# Patient Record
Sex: Female | Born: 1980 | Race: Black or African American | Hispanic: No | Marital: Single | Smoking: Never smoker
Health system: Southern US, Community
[De-identification: ages and names within clinical notes are randomized; demographics above are authoritative.]

---

## 2004-09-18 ENCOUNTER — Emergency Department: Payer: Self-pay | Admitting: Internal Medicine

## 2004-09-22 ENCOUNTER — Emergency Department: Payer: Self-pay | Admitting: Emergency Medicine

## 2013-02-28 ENCOUNTER — Emergency Department: Payer: Self-pay | Admitting: Emergency Medicine

## 2013-05-03 ENCOUNTER — Emergency Department: Payer: Self-pay | Admitting: Emergency Medicine

## 2013-05-03 LAB — URINALYSIS, COMPLETE
Bilirubin,UR: NEGATIVE
Glucose,UR: NEGATIVE mg/dL (ref 0–75)
Leukocyte Esterase: NEGATIVE
Nitrite: NEGATIVE
Squamous Epithelial: 4
WBC UR: 3 /HPF (ref 0–5)

## 2013-05-03 LAB — COMPREHENSIVE METABOLIC PANEL
Alkaline Phosphatase: 71 U/L (ref 50–136)
BUN: 6 mg/dL — ABNORMAL LOW (ref 7–18)
Calcium, Total: 9 mg/dL (ref 8.5–10.1)
Chloride: 104 mmol/L (ref 98–107)
EGFR (African American): >60
Glucose: 90 mg/dL (ref 65–99)
Osmolality: 271 (ref 275–301)
Potassium: 3.3 mmol/L — ABNORMAL LOW (ref 3.5–5.1)
SGOT(AST): 19 U/L (ref 15–37)
Sodium: 137 mmol/L (ref 136–145)
Total Protein: 8.6 g/dL — ABNORMAL HIGH (ref 6.4–8.2)

## 2013-05-03 LAB — CBC
MCH: 28.9 pg (ref 26.0–34.0)
MCHC: 33.4 g/dL (ref 32.0–36.0)
MCV: 87 fL (ref 80–100)
Platelet: 258 10*3/uL (ref 150–440)
WBC: 6.9 10*3/uL (ref 3.6–11.0)

## 2013-05-05 LAB — URINE CULTURE

## 2013-05-06 ENCOUNTER — Emergency Department: Payer: Self-pay | Admitting: Emergency Medicine

## 2017-10-25 ENCOUNTER — Emergency Department
Admission: EM | Admit: 2017-10-25 | Discharge: 2017-10-25 | Disposition: A | Discharge status: Home / Self Care | Payer: Self-pay | Attending: Emergency Medicine | Admitting: Emergency Medicine

## 2017-10-25 ENCOUNTER — Encounter: Payer: Self-pay | Admitting: Intensive Care

## 2017-10-25 ENCOUNTER — Emergency Department: Payer: Self-pay

## 2017-10-25 DIAGNOSIS — R519 Headache, unspecified: Secondary | ICD-10-CM

## 2017-10-25 DIAGNOSIS — R51 Headache: Secondary | ICD-10-CM | POA: Insufficient documentation

## 2017-10-25 LAB — COMPREHENSIVE METABOLIC PANEL
ALBUMIN: 4.7 g/dL (ref 3.5–5.0)
ALK PHOS: 48 U/L (ref 38–126)
ALT: 12 U/L — AB (ref 14–54)
AST: 29 U/L (ref 15–41)
Anion gap: 9 (ref 5–15)
BUN: 7 mg/dL (ref 6–20)
CALCIUM: 9.4 mg/dL (ref 8.9–10.3)
CHLORIDE: 105 mmol/L (ref 101–111)
CO2: 23 mmol/L (ref 22–32)
CREATININE: 0.52 mg/dL (ref 0.44–1.00)
GFR calc Af Amer: >60 mL/min (ref >60)
GFR calc non Af Amer: >60 mL/min (ref >60)
GLUCOSE: 87 mg/dL (ref 65–99)
Potassium: 4.1 mmol/L (ref 3.5–5.1)
SODIUM: 137 mmol/L (ref 135–145)
Total Bilirubin: 0.8 mg/dL (ref 0.3–1.2)
Total Protein: 8.5 g/dL — ABNORMAL HIGH (ref 6.5–8.1)

## 2017-10-25 LAB — CBC
HEMATOCRIT: 41.6 % (ref 35.0–47.0)
HEMOGLOBIN: 13.5 g/dL (ref 12.0–16.0)
MCH: 28 pg (ref 26.0–34.0)
MCHC: 32.4 g/dL (ref 32.0–36.0)
MCV: 86.4 fL (ref 80.0–100.0)
Platelets: 235 10*3/uL (ref 150–440)
RBC: 4.81 MIL/uL (ref 3.80–5.20)
RDW: 12.7 % (ref 11.5–14.5)
WBC: 5.6 10*3/uL (ref 3.6–11.0)

## 2017-10-25 LAB — URINALYSIS, COMPLETE (UACMP) WITH MICROSCOPIC
BACTERIA UA: NONE SEEN
BILIRUBIN URINE: NEGATIVE
Glucose, UA: NEGATIVE mg/dL
Hgb urine dipstick: NEGATIVE
KETONES UR: NEGATIVE mg/dL
Leukocytes, UA: NEGATIVE
NITRITE: NEGATIVE
PH: 7 (ref 5.0–8.0)
Protein, ur: NEGATIVE mg/dL
RBC / HPF: NONE SEEN RBC/hpf (ref 0–5)
SPECIFIC GRAVITY, URINE: 1.02 (ref 1.005–1.030)
WBC UA: NONE SEEN WBC/hpf (ref 0–5)

## 2017-10-25 LAB — POCT PREGNANCY, URINE: PREG TEST UR: NEGATIVE

## 2017-10-25 MED ORDER — KETOROLAC TROMETHAMINE 30 MG/ML IJ SOLN
30.0000 mg | Freq: Once | INTRAMUSCULAR | Status: AC
  Administered 2017-10-25: 30 mg via INTRAVENOUS

## 2017-10-25 MED ORDER — KETOROLAC TROMETHAMINE 10 MG PO TABS: 10.0000 mg | ORAL_TABLET | Freq: Four times a day (QID) | ORAL | 0 refills | Status: DC | PRN

## 2017-10-25 MED ORDER — KETOROLAC TROMETHAMINE 30 MG/ML IJ SOLN
30.0000 mg | Freq: Once | INTRAMUSCULAR | Status: DC
Start: 2017-10-25 — End: 2017-10-25
  Filled 2017-10-25: qty 1

## 2017-10-25 NOTE — ED Triage Notes (Addendum)
Patient c/o headache and blurry vision for two weeks. Patient has been seen by PCP and given sudafed for sinus infection. Patient drove here and reports no problems. Ambulatory in triage. Patient asked multiple times if she could drink her coffee while triaging

## 2017-10-25 NOTE — ED Notes (Signed)
See triage note  Presents with frontal headache and pressure behind eyes  States this pain has been on and off for "a while"  Has seen PCP and placed on sudafed for sinus problems  But states it did not help

## 2017-10-25 NOTE — ED Provider Notes (Signed)
[facility] Emergency Department Provider Note  ____________________________________________  Time seen: Approximately 9:37 AM  I have reviewed the triage vital signs and the nursing notes.   HISTORY  Chief Complaint Headache    HPI Robin Cohen is a 37 y.o. female that presents to the emergency department for evaluation of headache on and off since November.  Current headache has been on and off for 2 weeks.  Pain is primarily behind both of her eyes but wraps around to the sides.  She has had nausea, blurry vision and dizziness on and off as well.  She states that blurry vision is worse with distances.  She states that she was just reading her cell phone without blurry vision however when she looks at me, her vision seems blurry.  She is not sure what starts first, the blurry vision of the headache.  She does not wear glasses or contacts and has not seen an eye doctor recently.  She saw her primary care provider, who gave her Sudafed, Amoxicillin, and promethazine, which has not helped.  No shortness of breath, chest pain, nausea, vomiting, abdominal pain.   History reviewed. No pertinent past medical history.  There are no active problems to display for this patient.   History reviewed. No pertinent surgical history.  Prior to Admission medications   Medication Sig Start Date End Date Taking? Authorizing Provider  ketorolac (TORADOL) 10 MG tablet Take 1 tablet (10 mg total) by mouth every 6 (six) hours as needed. 10/25/17   Enid Derry, PA-C    Allergies Patient has no known allergies.  History reviewed. No pertinent family history.  Social History Social History   Tobacco Use  . Smoking status: Never Smoker  . Smokeless tobacco: Never Used  Substance Use Topics  . Alcohol use: Yes    Comment: occ  . Drug use: No     Review of Systems  Constitutional: No fever/chills Cardiovascular: No chest pain. Respiratory: No  SOB. Gastrointestinal: No abdominal pain.  No nausea, no vomiting.  Musculoskeletal: Negative for musculoskeletal pain. Skin: Negative for rash, abrasions, lacerations, ecchymosis. Neurological: Negative for numbness or tingling   ____________________________________________   PHYSICAL EXAM:  VITAL SIGNS: ED Triage Vitals  Enc Vitals Group     BP 10/25/17 0814 (!) 129/92     Pulse Rate 10/25/17 0814 90     Resp 10/25/17 0814 16     Temp 10/25/17 0818 98 F (36.7 C)     Temp Source 10/25/17 0818 Oral     SpO2 10/25/17 0814 100 %     Weight 10/25/17 0815 125 lb (56.7 kg)     Height 10/25/17 0815 5\' 8"  (1.727 m)     Head Circumference --      Peak Flow --      Pain Score --      Pain Loc --      Pain Edu? --      Excl. in GC? --      Constitutional: Alert and oriented. Well appearing and in no acute distress. Eyes: Conjunctivae are normal. PERRL. EOMI. Head: Atraumatic. ENT:      Ears:      Nose: No congestion/rhinnorhea.      Mouth/Throat: Mucous membranes are moist.  Neck: No stridor.   Cardiovascular: Normal rate, regular rhythm.  Good peripheral circulation. Respiratory: Normal respiratory effort without tachypnea or retractions. Lungs CTAB. Good air entry to the bases with no decreased or absent breath sounds. Gastrointestinal: Bowel sounds  4 quadrants. Soft and nontender to palpation. No guarding or rigidity. No palpable masses. No distention.  Musculoskeletal: Full range of motion to all extremities. No gross deformities appreciated. Neurologic:  Normal speech and language. No gross focal neurologic deficits are appreciated.  Skin:  Skin is warm, dry and intact. No rash noted.   ____________________________________________   LABS (all labs ordered are listed, but only abnormal results are displayed)  Labs Reviewed  COMPREHENSIVE METABOLIC PANEL - Abnormal; Notable for the following components:      Result Value   Total Protein 8.5 (*)    ALT 12 (*)     All other components within normal limits  URINALYSIS, COMPLETE (UACMP) WITH MICROSCOPIC - Abnormal; Notable for the following components:   Squamous Epithelial / LPF 0-5 (*)    All other components within normal limits  CBC  POC URINE PREG, ED  POCT PREGNANCY, URINE   ____________________________________________  EKG   ____________________________________________  RADIOLOGY   Ct Head Wo Contrast  Result Date: 10/25/2017 CLINICAL DATA:  Recurrent headaches and blurry vision for 2 weeks. EXAM: CT HEAD WITHOUT CONTRAST TECHNIQUE: Contiguous axial images were obtained from the base of the skull through the vertex without intravenous contrast. COMPARISON:  Head CT scan 09/18/2004. FINDINGS: Brain: No evidence of acute infarction, hemorrhage, hydrocephalus, extra-axial collection or mass lesion/mass effect. Vascular: No hyperdense vessel or unexpected calcification. Skull: Normal. Negative for fracture or focal lesion. Sinuses/Orbits: No acute finding. Other: None. IMPRESSION: Normal head CT. Electronically Signed   By: Drusilla Kannerhomas  Dalessio M.D.   On: 10/25/2017 11:10    ____________________________________________    PROCEDURES  Procedure(s) performed:    Procedures    Medications  ketorolac (TORADOL) 30 MG/ML injection 30 mg (30 mg Intravenous Given 10/25/17 1155)     ____________________________________________   INITIAL IMPRESSION / ASSESSMENT AND PLAN / ED COURSE  Pertinent labs & imaging results that were available during my care of the patient were reviewed by me and considered in my medical decision making (see chart for details).  Review of the [place] CSRS was performed in accordance of the [facility] prior to dispensing any controlled drugs.   Patient presented to the emergency department for evaluation of headache on and off since November. Vital signs, labwork and exam are reassuring. No infection on urinalysis.  No acute findings on CT. IM toradol was given in ED. Patient  will be discharged home with prescriptions for toradol. It is possible that patients vision os worsening and this is causing her headaches. Patient is to follow up with neurology and [facility] next week. Patient is given ED precautions to return to the ED for any worsening or new symptoms.     ____________________________________________  FINAL CLINICAL IMPRESSION(S) / ED DIAGNOSES  Final diagnoses:  Acute intractable headache, unspecified headache type      NEW MEDICATIONS STARTED DURING THIS VISIT:  ED Discharge Orders        Ordered    ketorolac (TORADOL) 10 MG tablet  Every 6 hours PRN     10/25/17 1146          This chart was dictated using voice recognition software/Dragon. Despite best efforts to proofread, errors can occur which can change the meaning. Any change was purely unintentional.    Enid DerryWagner, Korah Hufstedler, PA-C 10/25/17 1601    Sharyn CreamerQuale, Mark, MD 10/25/17 1650

## 2018-08-03 IMAGING — CT CT HEAD W/O CM
3 series · 16 of 46 positions shown, 19 images · non-contrast
Comparison: Head CT scan 09/18/2004.

CLINICAL DATA: Recurrent headaches and blurry vision for 2 weeks.

EXAM:
CT HEAD WITHOUT CONTRAST
TECHNIQUE: Contiguous axial images were obtained from the base of the skull
through the vertex without intravenous contrast.

[Series 2: head wo · axial · 0.43mm/px · z∈[-174,-54]mm · 10 of 29 slices shown, 13 images]
[im 3/29  brain]
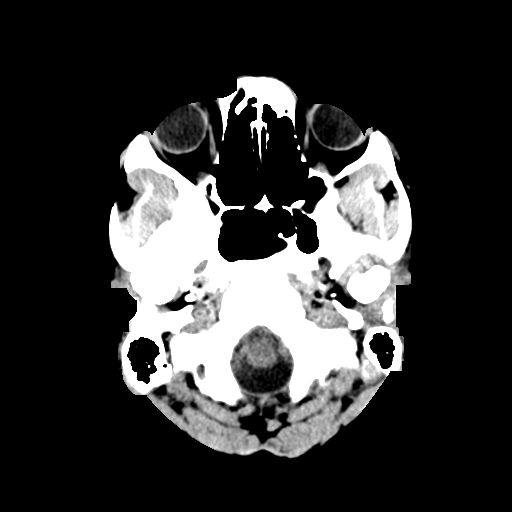
[im 3/29  bone]
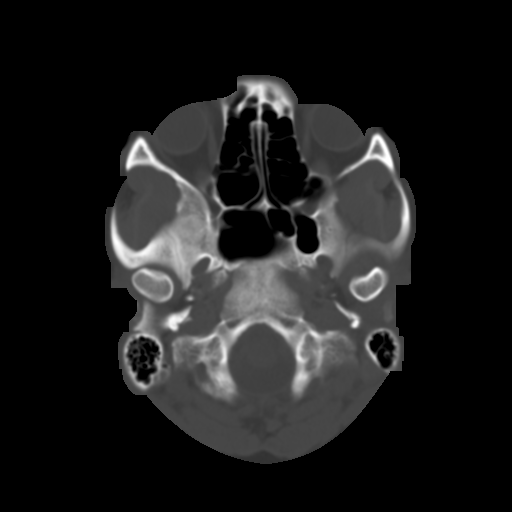
[im 6/29  brain]
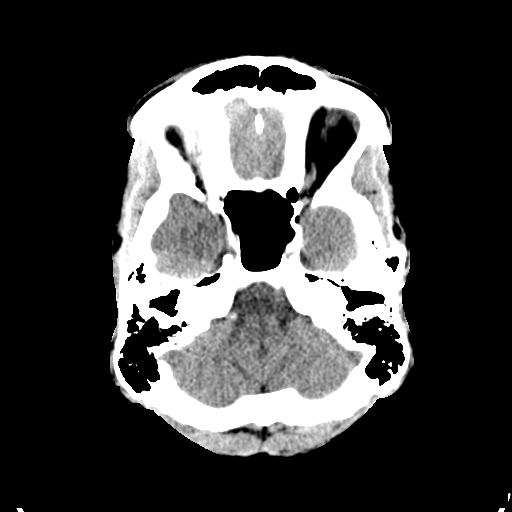
[im 8/29  brain]
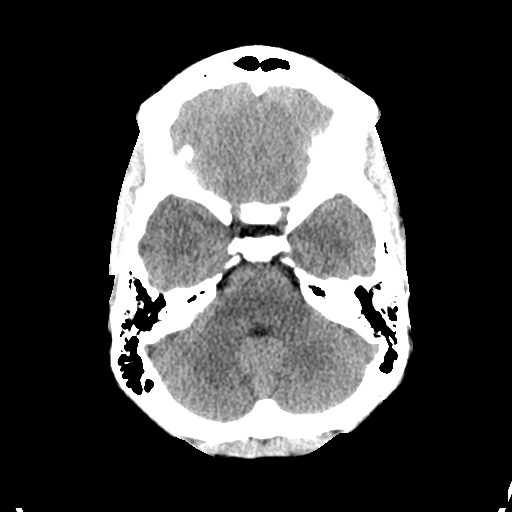
[im 11/29  brain]
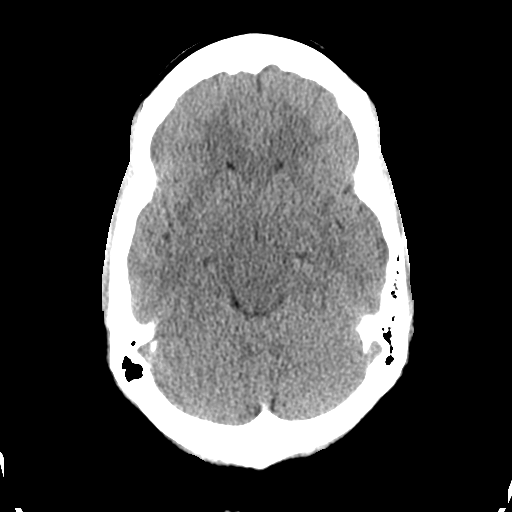
[im 14/29  brain]
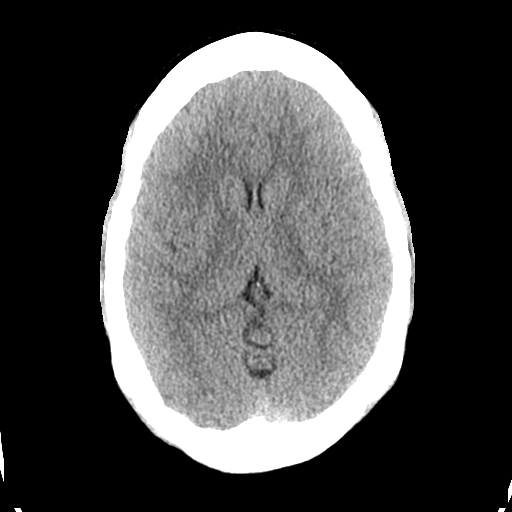
[im 14/29  bone]
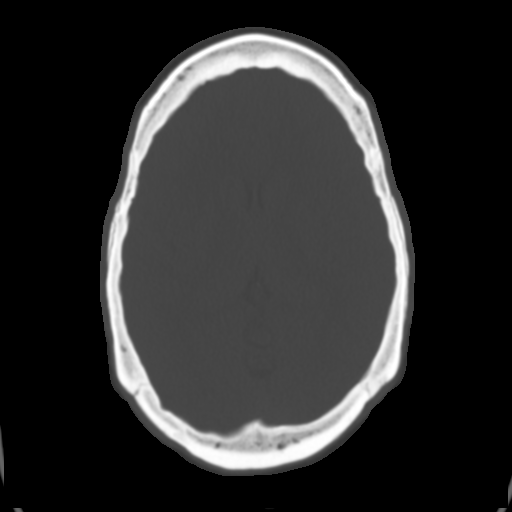
[im 16/29  brain]
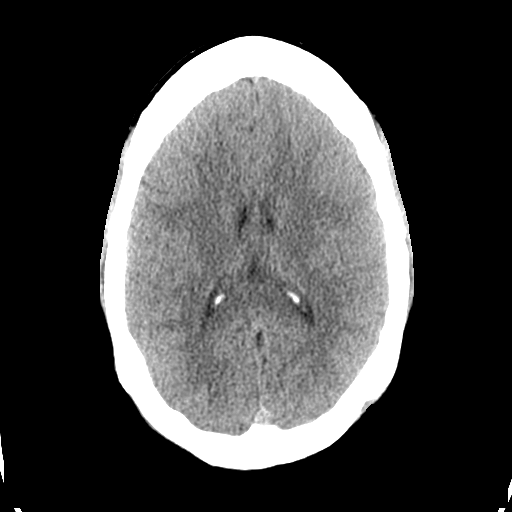
[im 19/29  brain]
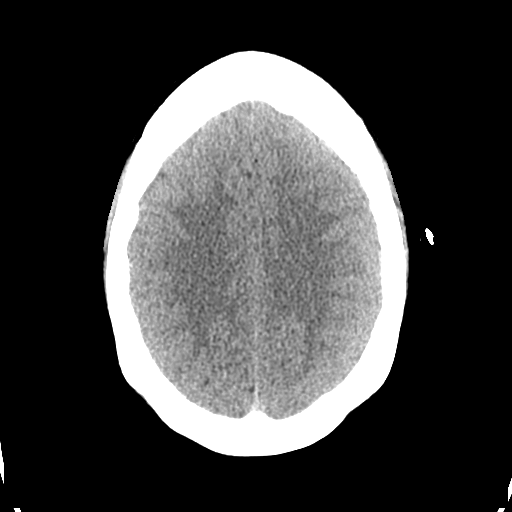
[im 22/29  brain]
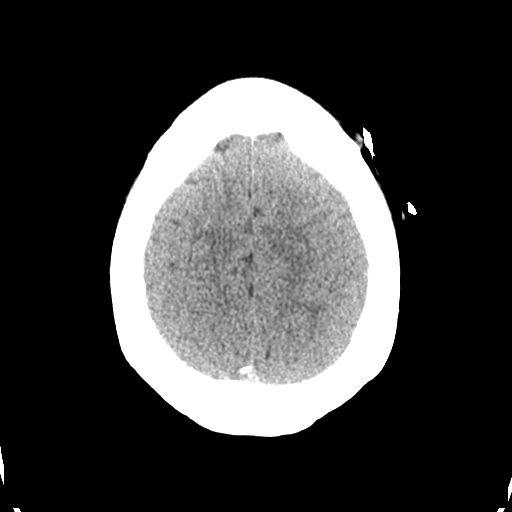
[im 24/29  brain]
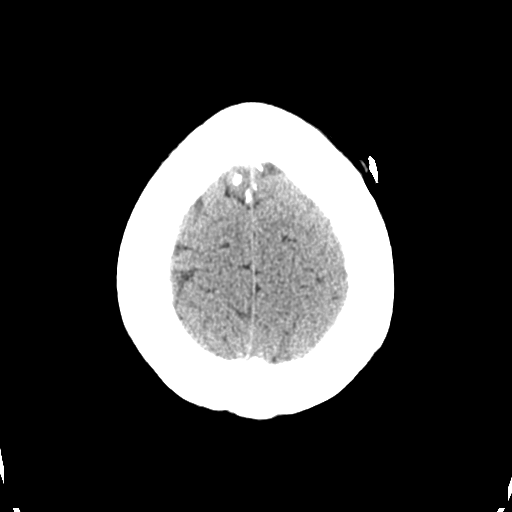
[im 24/29  bone]
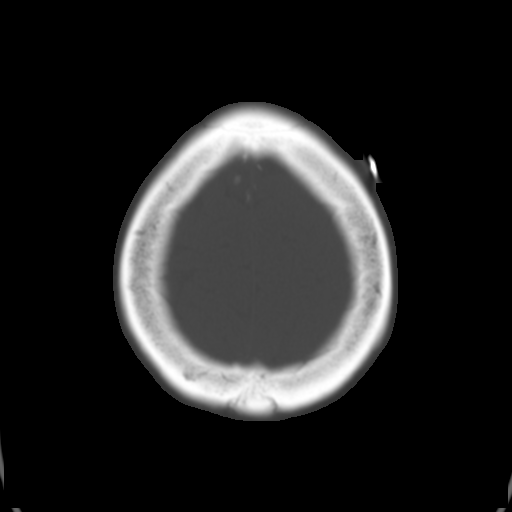
[im 27/29  brain]
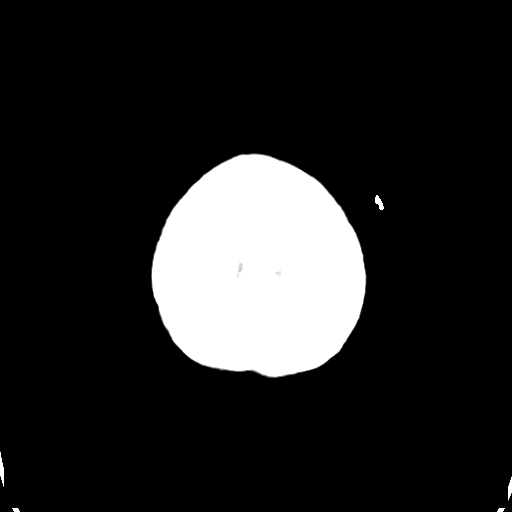

[Series 4: coronal soft tissue · coronal · 0.35mm/px · 3 of 69 slices shown]
[im 23/69  brain]
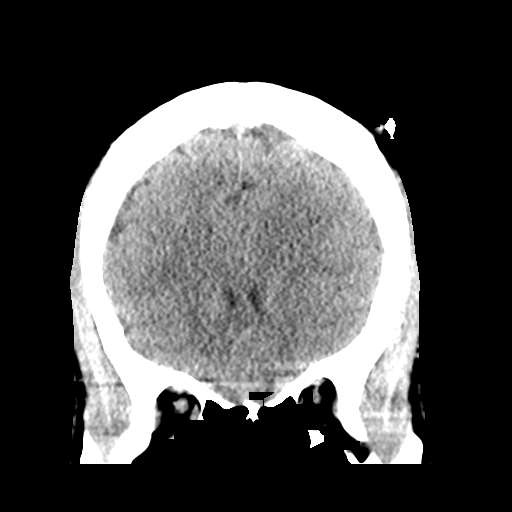
[im 31/69  brain]
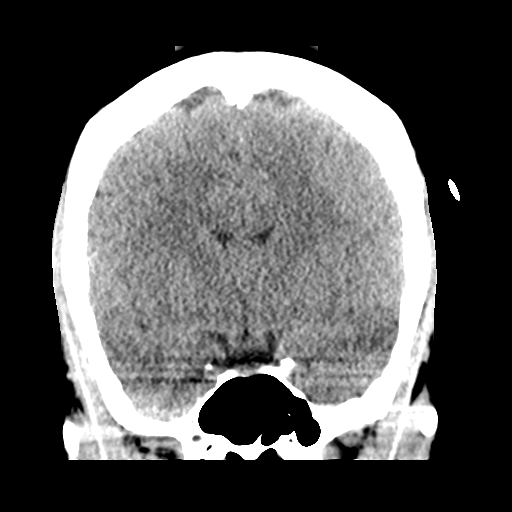
[im 38/69  brain]
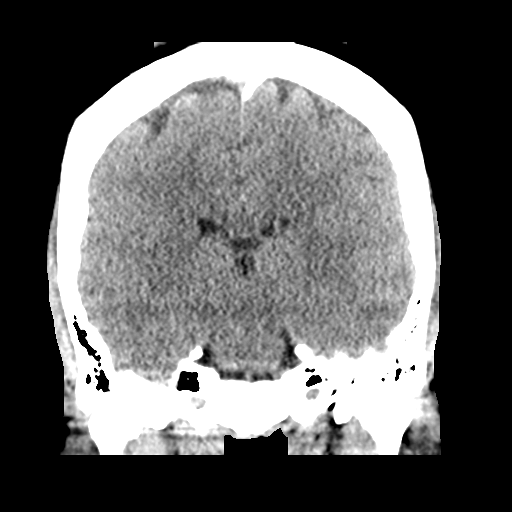

[Series 5: sagittal soft tissue · sagittal · 0.32mm/px · 3 of 52 slices shown]
[im 18/52  brain]
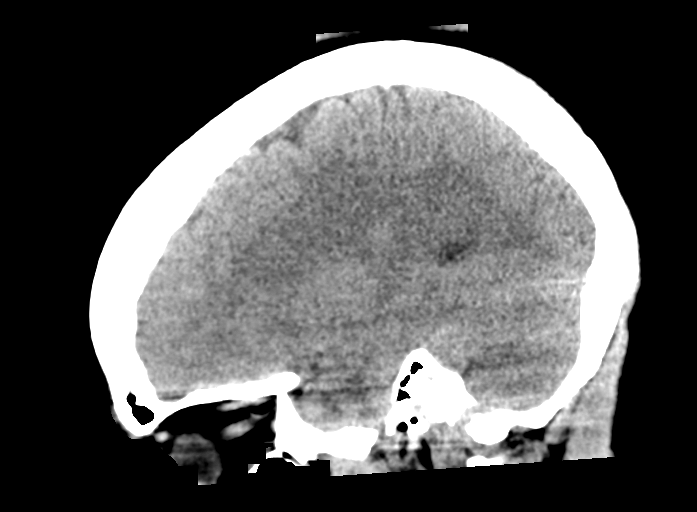
[im 26/52  brain]
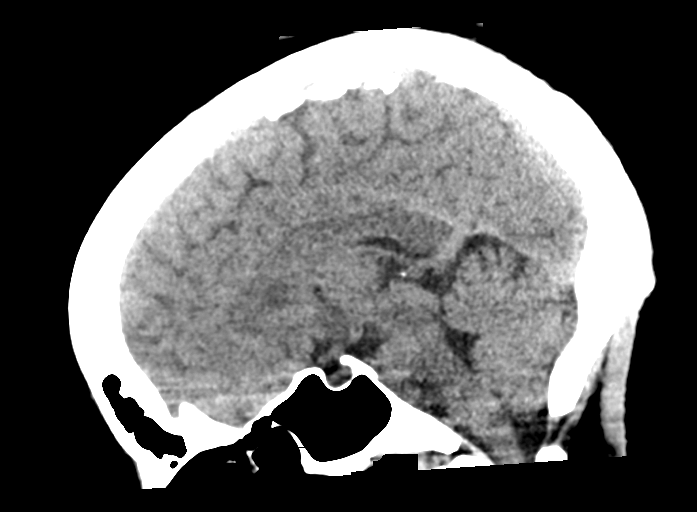
[im 35/52  brain]
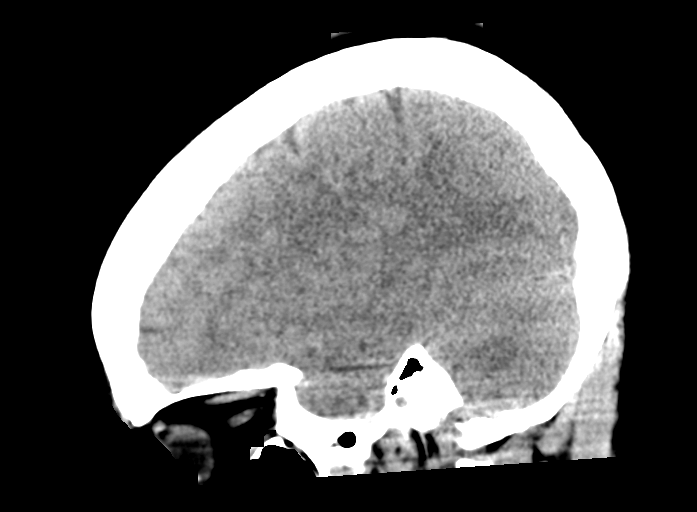

[16 of 46 positions shown; findings below may reference images not displayed]

FINDINGS: Brain: No evidence of acute infarction, hemorrhage, hydrocephalus,
extra-axial collection or mass lesion/mass effect.

Vascular: No hyperdense vessel or unexpected calcification.

Skull: Normal. Negative for fracture or focal lesion.

Sinuses/Orbits: No acute finding.

Other: None.
IMPRESSION: Normal head CT.

## 2019-04-11 ENCOUNTER — Emergency Department
Admission: EM | Admit: 2019-04-11 | Discharge: 2019-04-11 | Disposition: A | Discharge status: Home / Self Care | Payer: Self-pay | Attending: Emergency Medicine | Admitting: Emergency Medicine

## 2019-04-11 ENCOUNTER — Other Ambulatory Visit: Payer: Self-pay

## 2019-04-11 DIAGNOSIS — J029 Acute pharyngitis, unspecified: Secondary | ICD-10-CM | POA: Insufficient documentation

## 2019-04-11 DIAGNOSIS — M542 Cervicalgia: Secondary | ICD-10-CM

## 2019-04-11 DIAGNOSIS — H9203 Otalgia, bilateral: Secondary | ICD-10-CM

## 2019-04-11 DIAGNOSIS — H6503 Acute serous otitis media, bilateral: Secondary | ICD-10-CM | POA: Insufficient documentation

## 2019-04-11 DIAGNOSIS — R0981 Nasal congestion: Secondary | ICD-10-CM | POA: Insufficient documentation

## 2019-04-11 DIAGNOSIS — Z79899 Other long term (current) drug therapy: Secondary | ICD-10-CM | POA: Insufficient documentation

## 2019-04-11 MED ORDER — PREDNISONE 10 MG PO TABS: 10.0000 mg | ORAL_TABLET | Freq: Every day | ORAL | 0 refills | Status: AC

## 2019-04-11 NOTE — ED Triage Notes (Signed)
C/o left sided neck pain and upper back pain today. No injury noted. Pt alert and oriented X4, active, cooperative, pt in NAD. RR even and unlabored, color WNL.

## 2019-04-11 NOTE — ED Notes (Signed)
See triage note  Presents with pain to left side of neck  States the pain started this am  Pain also radiates across upper shoulders

## 2019-04-11 NOTE — ED Provider Notes (Signed)
[facility] EMERGENCY DEPARTMENT Provider Note   CSN: [id number] Arrival date & time: 04/11/19  1738     History   Chief Complaint Chief Complaint  Patient presents with  . Neck Pain  . Back Pain    HPI Robin Cohen is a 38 y.o. female presents to the emergency department for evaluation of left-sided neck pain she describes as tightness and spasm increased with cervical rotation to the right.  No numbness tingling radicular symptoms.  No trauma or injury.  She states she has been sleeping on her couch for the last few weeks.  She also complains of sore throat, nasal congestion and bilateral ear pressure.  She denies any fevers, cough, chest pain, shortness of breath, ear drainage.  She has been taking decongestant, pseudoephedrine with little relief.  She is not taking any muscle relaxers or anti-inflammatory medications.  No headaches, vision changes, neck stiffness, fevers, chills, night sweats, weight loss     HPI  History reviewed. No pertinent past medical history.  There are no active problems to display for this patient.   History reviewed. No pertinent surgical history.   OB History   No obstetric history on file.      Home Medications    Prior to Admission medications   Medication Sig Start Date End Date Taking? Authorizing Provider  omeprazole (PRILOSEC) 40 MG capsule Take 40 mg by mouth daily.   Yes [provider]    Family History No family history on file.  Social History Social History   Tobacco Use  . Smoking status: Never Smoker  . Smokeless tobacco: Never Used  Substance Use Topics  . Alcohol use: Yes    Comment: occ  . Drug use: No     Allergies   Patient has no known allergies.   Review of Systems Review of Systems  Constitutional: Negative for chills and fever.  HENT: Positive for congestion, rhinorrhea and sore throat. Negative for sinus pressure, sinus pain and trouble swallowing.    Respiratory: Negative for cough and shortness of breath.   Gastrointestinal: Negative for nausea and vomiting.  Musculoskeletal: Positive for neck pain. Negative for gait problem, joint swelling, myalgias and neck stiffness.  Neurological: Negative for dizziness, light-headedness, numbness and headaches.     Physical Exam Updated Vital Signs BP (!) 156/99 (BP Location: Left Arm)   Pulse (!) 114   Temp 99.2 F (37.3 C) (Oral)   Resp 16   Ht 5\' 8"  (1.727 m)   Wt 73.5 kg   LMP 03/24/2019 (Approximate)   SpO2 100%   BMI 24.63 kg/m   Physical Exam Constitutional:      General: She is not in acute distress.    Appearance: Normal appearance. She is well-developed.  HENT:     Head: Normocephalic and atraumatic.     Comments: Bilateral TMs with moderate clear fluid present, no erythema or bulging.  No drainage.  Canals are normal.    Right Ear: There is no impacted cerumen.     Nose: Congestion present.     Mouth/Throat:     Pharynx: No oropharyngeal exudate or posterior oropharyngeal erythema.  Eyes:     Conjunctiva/sclera: Conjunctivae normal.  Neck:     Musculoskeletal: Normal range of motion and neck supple. No muscular tenderness.     Comments: No abnormal soft tissue swelling noted along the left side of the neck.  No tenderness, warmth, redness.  No supraclavicular lymphadenopathy.  No cervical lymphadenopathy.  Mild  tightness to the left trapezius but no significant tenderness.  Full range of motion of the cervical spine with negative head jolt test. Cardiovascular:     Rate and Rhythm: Tachycardia present.     Pulses: Normal pulses.     Heart sounds: Normal heart sounds.  Pulmonary:     Effort: Pulmonary effort is normal. No respiratory distress.     Breath sounds: Normal breath sounds. No wheezing, rhonchi or rales.  Abdominal:     General: There is no distension.  Musculoskeletal: Normal range of motion.  Lymphadenopathy:     Cervical: No cervical adenopathy.   Skin:    General: Skin is warm.     Findings: No rash.  Neurological:     Mental Status: She is alert and oriented to person, place, and time.  Psychiatric:        Behavior: Behavior normal.        Thought Content: Thought content normal.      ED Treatments / Results  Labs (all labs ordered are listed, but only abnormal results are displayed) Labs Reviewed - No data to display  EKG None  Radiology No results found.  Procedures Procedures (including critical care time)  Medications Ordered in ED Medications - No data to display   Initial Impression / Assessment and Plan / ED Course  I have reviewed the triage vital signs and the nursing notes.  Pertinent labs & imaging results that were available during my care of the patient were reviewed by me and considered in my medical decision making (see chart for details).        38 year old female with nasal congestion, sore throat, ear pressure.  She is been taking pseudoephedrine with mild improvement.  She also complains of left-sided neck pain, no appreciable lymphadenopathy.  Vital signs are stable.  No chest pain, shortness of breath or cough.  Will place on prednisone.  She will continue with decongestant.  She will increase p.o. fluids.  She understands signs symptoms return to ED for.  Final Clinical Impressions(s) / ED Diagnoses   Final diagnoses:  Sore throat  Acute ear pain, bilateral  Non-recurrent acute serous otitis media of both ears  Nasal congestion  Neck pain    ED Discharge Orders    None       Ronnette JuniperGaines, Kanda Deluna C, PA-C 04/11/19 Stark [id number]    Isaacs, Cameron, MD 04/17/19 [phone]

## 2019-04-11 NOTE — Discharge Instructions (Addendum)
Please continue to take Sudafed as needed for nasal congestion.  Take prednisone as prescribed.  If any fevers, shortness of breath, return to the emergency department.

## 2023-08-01 ENCOUNTER — Other Ambulatory Visit (HOSPITAL_COMMUNITY): Payer: Self-pay | Admitting: Family

## 2023-08-01 DIAGNOSIS — Z1231 Encounter for screening mammogram for malignant neoplasm of breast: Secondary | ICD-10-CM
